# Patient Record
Sex: Female | Born: 1990 | Race: Black or African American | Hispanic: No | Marital: Single | Smoking: Current every day smoker
Health system: Southern US, Community
[De-identification: ages and names within clinical notes are randomized; demographics above are authoritative.]

## PROBLEM LIST (undated history)

## (undated) DIAGNOSIS — G43909 Migraine, unspecified, not intractable, without status migrainosus: Secondary | ICD-10-CM

## (undated) HISTORY — PX: HERNIA REPAIR: SHX51

---

## 2016-02-24 ENCOUNTER — Encounter (HOSPITAL_COMMUNITY): Payer: Self-pay | Admitting: *Deleted

## 2016-02-24 DIAGNOSIS — A084 Viral intestinal infection, unspecified: Secondary | ICD-10-CM | POA: Insufficient documentation

## 2016-02-24 DIAGNOSIS — F17210 Nicotine dependence, cigarettes, uncomplicated: Secondary | ICD-10-CM | POA: Insufficient documentation

## 2016-02-24 NOTE — ED Notes (Signed)
Pt c/o emesis since this morning. Also endorses diarrhea. Emesisx8, diarrheax3.

## 2016-02-25 ENCOUNTER — Emergency Department (HOSPITAL_COMMUNITY)
Admission: EM | Admit: 2016-02-25 | Discharge: 2016-02-25 | Disposition: A | Discharge status: Home / Self Care | Payer: Self-pay | Attending: Emergency Medicine | Admitting: Emergency Medicine

## 2016-02-25 DIAGNOSIS — A084 Viral intestinal infection, unspecified: Secondary | ICD-10-CM

## 2016-02-25 HISTORY — DX: Migraine, unspecified, not intractable, without status migrainosus: G43.909

## 2016-02-25 LAB — COMPREHENSIVE METABOLIC PANEL
ALT: 17 U/L (ref 14–54)
AST: 20 U/L (ref 15–41)
Albumin: 3.8 g/dL (ref 3.5–5.0)
Alkaline Phosphatase: 45 U/L (ref 38–126)
Anion gap: 3 — ABNORMAL LOW (ref 5–15)
BUN: 9 mg/dL (ref 6–20)
CHLORIDE: 109 mmol/L (ref 101–111)
CO2: 25 mmol/L (ref 22–32)
CREATININE: 0.83 mg/dL (ref 0.44–1.00)
Calcium: 8.9 mg/dL (ref 8.9–10.3)
GFR calc Af Amer: >60 mL/min (ref >60)
GFR calc non Af Amer: >60 mL/min (ref >60)
Glucose, Bld: 96 mg/dL (ref 65–99)
Potassium: 3.9 mmol/L (ref 3.5–5.1)
SODIUM: 137 mmol/L (ref 135–145)
Total Bilirubin: 0.6 mg/dL (ref 0.3–1.2)
Total Protein: 6.7 g/dL (ref 6.5–8.1)

## 2016-02-25 LAB — CBC
HCT: 37.4 % (ref 36.0–46.0)
Hemoglobin: 12.1 g/dL (ref 12.0–15.0)
MCH: 29.3 pg (ref 26.0–34.0)
MCHC: 32.4 g/dL (ref 30.0–36.0)
MCV: 90.6 fL (ref 78.0–100.0)
PLATELETS: 276 10*3/uL (ref 150–400)
RBC: 4.13 MIL/uL (ref 3.87–5.11)
RDW: 12.9 % (ref 11.5–15.5)
WBC: 4.8 10*3/uL (ref 4.0–10.5)

## 2016-02-25 LAB — URINALYSIS, ROUTINE W REFLEX MICROSCOPIC
BILIRUBIN URINE: NEGATIVE
GLUCOSE, UA: NEGATIVE mg/dL
KETONES UR: NEGATIVE mg/dL
Leukocytes, UA: NEGATIVE
Nitrite: NEGATIVE
PROTEIN: NEGATIVE mg/dL
Specific Gravity, Urine: 1.024 (ref 1.005–1.030)
pH: 7 (ref 5.0–8.0)

## 2016-02-25 LAB — I-STAT BETA HCG BLOOD, ED (MC, WL, AP ONLY): I-stat hCG, quantitative: <5 m[IU]/mL (ref <5)

## 2016-02-25 LAB — URINE MICROSCOPIC-ADD ON

## 2016-02-25 LAB — LIPASE, BLOOD: LIPASE: 41 U/L (ref 11–51)

## 2016-02-25 MED ORDER — SODIUM CHLORIDE 0.9 % IV BOLUS (SEPSIS)
1000.0000 mL | Freq: Once | INTRAVENOUS | Status: AC
  Administered 2016-02-25: 1000 mL via INTRAVENOUS

## 2016-02-25 MED ORDER — METOCLOPRAMIDE HCL 5 MG/ML IJ SOLN
10.0000 mg | INTRAMUSCULAR | Status: AC
  Administered 2016-02-25: 10 mg via INTRAVENOUS
  Filled 2016-02-25: qty 2

## 2016-02-25 MED ORDER — KETOROLAC TROMETHAMINE 30 MG/ML IJ SOLN
30.0000 mg | Freq: Once | INTRAMUSCULAR | Status: AC
  Administered 2016-02-25: 30 mg via INTRAVENOUS
  Filled 2016-02-25: qty 1

## 2016-02-25 MED ORDER — PROMETHAZINE HCL 25 MG PO TABS: 25.0000 mg | ORAL_TABLET | Freq: Four times a day (QID) | ORAL | Status: AC | PRN

## 2016-02-25 NOTE — Discharge Instructions (Signed)

## 2016-02-25 NOTE — ED Provider Notes (Signed)
CSN: [id number]     Arrival date & time 02/24/16  2330 History   First MD Initiated Contact with Patient 02/25/16 [phone]     Chief Complaint  Patient presents with  . Emesis     (Consider location/radiation/quality/duration/timing/severity/associated sxs/prior Treatment) HPI Comments: 25 year old female with a history of migraine headaches presents to the emergency department for evaluation of nausea, vomiting, and diarrhea. She states the diarrhea began a few days ago. She has had 3 watery, nonbloody bowel movements today. She developed nausea and vomiting yesterday morning shortly after waking. She has had 8 episodes of nonbloody, nonbilious emesis and onset. She denies taking any medications prior to arrival for symptoms. Her last episode of emesis was at approximately 2100. She declined Zofran in the emergency department because it has not helped her with nausea in the past. She states that she had some generalized abdominal cramping which has improved. She has been able to tolerate a small amount of fluids while in the emergency department. Patient with a history of hernia repair; no other abdominal surgeries. She has had no fever, chest pain, shortness of breath, dysuria or hematuria, or vaginal complaints. Patient is currently on her menstrual cycle. She denies known sick contacts.  Patient is a 25 y.o. female presenting with vomiting. The history is provided by the patient. No language interpreter was used.  Emesis Associated symptoms: abdominal pain (mild, diffuse, cramping) and diarrhea     Past Medical History  Diagnosis Date  . Migraines    Past Surgical History  Procedure Laterality Date  . Hernia repair     No family history on file. Social History  Substance Use Topics  . Smoking status: Current Every Day Smoker -- 0.50 packs/day    Types: Cigarettes  . Smokeless tobacco: None  . Alcohol Use: Yes   OB History    No data available      Review of Systems   Constitutional: Negative for fever.  Gastrointestinal: Positive for nausea, vomiting, abdominal pain (mild, diffuse, cramping) and diarrhea.  Genitourinary: Positive for vaginal bleeding (on menses). Negative for dysuria and hematuria.  All other systems reviewed and are negative.   Allergies  Review of patient's allergies indicates no known allergies.  Home Medications   Prior to Admission medications   Medication Sig Start Date End Date Taking? Authorizing Provider  medroxyPROGESTERone (DEPO-PROVERA) 150 MG/ML injection Inject 150 mg into the muscle every 3 (three) months.   Yes Historical Provider, MD  promethazine (PHENERGAN) 25 MG tablet Take 1 tablet (25 mg total) by mouth every 6 (six) hours as needed for nausea or vomiting. 02/25/16   Antony MaduraKelly Khristian Phillippi, PA-C   BP 115/76 mmHg  Pulse 55  Temp(Src) 98.3 F (36.8 C) (Oral)  Resp 16  SpO2 100%  LMP 02/23/2016   Physical Exam  Constitutional: She is oriented to person, place, and time. She appears well-developed and well-nourished. No distress.  Nontoxic appearing, in no distress.  HENT:  Head: Normocephalic and atraumatic.  Eyes: Conjunctivae and EOM are normal. No scleral icterus.  Neck: Normal range of motion.  Cardiovascular: Normal rate, regular rhythm and intact distal pulses.   Pulmonary/Chest: Effort normal. No respiratory distress. She has no wheezes.  Respirations even and unlabored  Abdominal: Soft. She exhibits no distension. There is no tenderness. There is no rebound and no guarding.  Soft, nontender, nondistended abodmen  Musculoskeletal: Normal range of motion.  Neurological: She is alert and oriented to person, place, and time. She exhibits normal muscle tone.  Coordination normal.  GCS 15. Patient moving all extremities.  Skin: Skin is warm and dry. No rash noted. She is not diaphoretic. No erythema. No pallor.  Psychiatric: She has a normal mood and affect. Her behavior is normal.  Nursing note and vitals  reviewed.   ED Course  Procedures (including critical care time) Labs Review Labs Reviewed  COMPREHENSIVE METABOLIC PANEL - Abnormal; Notable for the following:    Anion gap 3 (*)    All other components within normal limits  URINALYSIS, ROUTINE W REFLEX MICROSCOPIC (NOT AT [facility]) - Abnormal; Notable for the following:    Hgb urine dipstick MODERATE (*)    All other components within normal limits  URINE MICROSCOPIC-ADD ON - Abnormal; Notable for the following:    Squamous Epithelial / LPF 0-5 (*)    Bacteria, UA RARE (*)    All other components within normal limits  LIPASE, BLOOD  CBC  I-STAT BETA HCG BLOOD, ED ([facility], [facility], [facility] ONLY)    Imaging Review No results found.   I have personally reviewed and evaluated these images and lab results as part of my medical decision-making.   EKG Interpretation None       6:06 AM Patient reassessed. Abdominal exam stable. Patient with no additional complaints. MDM   Final diagnoses:  Viral gastroenteritis    Patient with symptoms consistent with viral gastroenteritis. Vitals are stable, no fever. No signs of dehydration, tolerating PO fluids > 6 oz. Lungs are clear. No focal abdominal pain and reexamination stable. Doubt appendicitis, cholecystitis, pancreatitis, ruptured viscus, UTI, kidney stone, or other emergent abdominal etiology. Supportive therapy indicated with return if symptoms worsen. Patient discharged in satisfactory condition with no unaddressed concerns.   Filed Vitals:   02/25/16 0341 02/25/16 0415 02/25/16 0430 02/25/16 0515  BP: 113/59 104/67 110/86 115/76  Pulse: 60 65 59 55  Temp:      TempSrc:      Resp: 16     SpO2: 100% 100% 100% 100%     Antony MaduraKelly Draya Felker, PA-C 02/25/16 [phone]  Tomasita CrumbleAdeleke Oni, MD 02/25/16 [phone]

## 2017-12-11 ENCOUNTER — Encounter (HOSPITAL_COMMUNITY): Payer: Self-pay | Admitting: Emergency Medicine

## 2017-12-11 ENCOUNTER — Emergency Department (HOSPITAL_COMMUNITY): Payer: Medicaid Other

## 2017-12-11 ENCOUNTER — Emergency Department (HOSPITAL_COMMUNITY)
Admission: EM | Admit: 2017-12-11 | Discharge: 2017-12-11 | Disposition: A | Discharge status: Home / Self Care | Payer: Medicaid Other | Attending: Emergency Medicine | Admitting: Emergency Medicine

## 2017-12-11 DIAGNOSIS — S93401A Sprain of unspecified ligament of right ankle, initial encounter: Secondary | ICD-10-CM | POA: Insufficient documentation

## 2017-12-11 DIAGNOSIS — Y999 Unspecified external cause status: Secondary | ICD-10-CM | POA: Insufficient documentation

## 2017-12-11 DIAGNOSIS — Y929 Unspecified place or not applicable: Secondary | ICD-10-CM | POA: Insufficient documentation

## 2017-12-11 DIAGNOSIS — X58XXXA Exposure to other specified factors, initial encounter: Secondary | ICD-10-CM | POA: Insufficient documentation

## 2017-12-11 DIAGNOSIS — Y939 Activity, unspecified: Secondary | ICD-10-CM | POA: Insufficient documentation

## 2017-12-11 NOTE — ED Triage Notes (Signed)
Pt from home with c/o right ankle pain x 2 weeks ago. Pt states she was seen for this and was told by [facility] that it is a sprained ankle. Pt is ambulatory without difficulty. Pt rates pain 6/10

## 2017-12-11 NOTE — ED Provider Notes (Signed)
[facility]-EMERGENCY DEPT Provider Note   CSN: [id number] Arrival date & time: 12/11/17  4098     History   Chief Complaint Chief Complaint  Patient presents with  . Ankle Pain    right    HPI Karen Gray is a 27 y.o. female.  HPI Patient reports injury to her right ankle several weeks ago.  She states it is feeling much better.  She returns to the ER today because her work needs a work release form stating that she can safely return to work.  She states she has been able to walk on it over the past several days and is feeling much better.  She was initially referred to an orthopedic surgeon but does not have insurance and was unable to follow-up.  She has no significant pain at this time.  She denies numbness or paresthesias.  She reports no significant swelling of her right foot or ankle.   Past Medical History:  Diagnosis Date  . Migraines     There are no active problems to display for this patient.   Past Surgical History:  Procedure Laterality Date  . HERNIA REPAIR       OB History   None      Home Medications    Prior to Admission medications   Medication Sig Start Date End Date Taking? Authorizing Provider  medroxyPROGESTERone (DEPO-PROVERA) 150 MG/ML injection Inject 150 mg into the muscle every 3 (three) months.   Yes [provider]  promethazine (PHENERGAN) 25 MG tablet Take 1 tablet (25 mg total) by mouth every 6 (six) hours as needed for nausea or vomiting. 02/25/16  Yes Antony Madura, PA-C    Family History No family history on file.  Social History Social History   Tobacco Use  . Smoking status: Current Every Day Smoker    Packs/day: 0.50    Types: Cigarettes  Substance Use Topics  . Alcohol use: Yes  . Drug use: No     Allergies   Patient has no known allergies.   Review of Systems Review of Systems  All other systems reviewed and are negative.    Physical Exam Updated Vital  Signs BP 117/74 (BP Location: Right Arm)   Pulse 85   Temp 98.9 F (37.2 C) (Oral)   Resp 16   LMP 12/11/2017 (Exact Date)   SpO2 100%   Physical Exam  Constitutional: She is oriented to person, place, and time. She appears well-developed and well-nourished.  HENT:  Head: Normocephalic.  Eyes: EOM are normal.  Neck: Normal range of motion.  Pulmonary/Chest: Effort normal.  Abdominal: She exhibits no distension.  Musculoskeletal: Normal range of motion.  Normal PT and DP pulse in the right foot.  Compartments of the right lower extremity and right foot are soft.  No tenderness of the medial or lateral malleolus of the right ankle.  No tenderness at the base of the right fifth metacarpal.  Neurological: She is alert and oriented to person, place, and time.  Psychiatric: She has a normal mood and affect.  Nursing note and vitals reviewed.    ED Treatments / Results  Labs (all labs ordered are listed, but only abnormal results are displayed) Labs Reviewed - No data to display  EKG None  Radiology Dg Ankle Complete Right  Result Date: 12/11/2017 CLINICAL DATA:  Diffuse right ankle pain EXAM: RIGHT ANKLE - COMPLETE 3+ VIEW COMPARISON:  None. FINDINGS: There is no evidence of fracture, dislocation, or  joint effusion. There is no evidence of arthropathy or other focal bone abnormality. Soft tissues are unremarkable. IMPRESSION: Negative. Electronically Signed   By: Charlett NoseKevin  Dover M.D.   On: 12/11/2017 07:48    Procedures Procedures (including critical care time)  Medications Ordered in ED Medications - No data to display   Initial Impression / Assessment and Plan / ED Course  I have reviewed the triage vital signs and the nursing notes.  Pertinent labs & imaging results that were available during my care of the patient were reviewed by me and considered in my medical decision making (see chart for details).     X-ray personally reviewed.  X-ray demonstrates no acute osseous  injury or obvious soft tissue swelling  Patient can safely return to work with weightbearing as tolerated.  X-ray without osseous injury.  Final Clinical Impressions(s) / ED Diagnoses   Final diagnoses:  None    ED Discharge Orders    None       Azalia Bilisampos, Farzad Tibbetts, MD 12/11/17 [phone]

## 2018-02-11 ENCOUNTER — Other Ambulatory Visit: Payer: Self-pay

## 2018-02-11 ENCOUNTER — Emergency Department (HOSPITAL_BASED_OUTPATIENT_CLINIC_OR_DEPARTMENT_OTHER)
Admission: EM | Admit: 2018-02-11 | Discharge: 2018-02-11 | Disposition: A | Discharge status: Home / Self Care | Payer: Self-pay | Attending: Physician Assistant | Admitting: Physician Assistant

## 2018-02-11 ENCOUNTER — Encounter (HOSPITAL_BASED_OUTPATIENT_CLINIC_OR_DEPARTMENT_OTHER): Payer: Self-pay | Admitting: *Deleted

## 2018-02-11 ENCOUNTER — Emergency Department (HOSPITAL_BASED_OUTPATIENT_CLINIC_OR_DEPARTMENT_OTHER): Payer: Self-pay

## 2018-02-11 DIAGNOSIS — R059 Cough, unspecified: Secondary | ICD-10-CM

## 2018-02-11 DIAGNOSIS — F17210 Nicotine dependence, cigarettes, uncomplicated: Secondary | ICD-10-CM | POA: Insufficient documentation

## 2018-02-11 DIAGNOSIS — Z3202 Encounter for pregnancy test, result negative: Secondary | ICD-10-CM | POA: Insufficient documentation

## 2018-02-11 DIAGNOSIS — R112 Nausea with vomiting, unspecified: Secondary | ICD-10-CM | POA: Insufficient documentation

## 2018-02-11 DIAGNOSIS — R05 Cough: Secondary | ICD-10-CM | POA: Insufficient documentation

## 2018-02-11 LAB — URINALYSIS, ROUTINE W REFLEX MICROSCOPIC
Bilirubin Urine: NEGATIVE
Glucose, UA: NEGATIVE mg/dL
Ketones, ur: NEGATIVE mg/dL
Leukocytes, UA: NEGATIVE
Nitrite: NEGATIVE
Protein, ur: NEGATIVE mg/dL
Specific Gravity, Urine: 1.01 (ref 1.005–1.030)
pH: 6.5 (ref 5.0–8.0)

## 2018-02-11 LAB — COMPREHENSIVE METABOLIC PANEL
ALBUMIN: 4.2 g/dL (ref 3.5–5.0)
ALT: 10 U/L — AB (ref 14–54)
AST: 19 U/L (ref 15–41)
Alkaline Phosphatase: 71 U/L (ref 38–126)
Anion gap: 6 (ref 5–15)
BUN: 8 mg/dL (ref 6–20)
CO2: 28 mmol/L (ref 22–32)
CREATININE: 0.83 mg/dL (ref 0.44–1.00)
Calcium: 8.8 mg/dL — ABNORMAL LOW (ref 8.9–10.3)
Chloride: 102 mmol/L (ref 101–111)
GFR calc Af Amer: >60 mL/min (ref >60)
GLUCOSE: 118 mg/dL — AB (ref 65–99)
POTASSIUM: 3.4 mmol/L — AB (ref 3.5–5.1)
Sodium: 136 mmol/L (ref 135–145)
Total Bilirubin: 0.9 mg/dL (ref 0.3–1.2)
Total Protein: 7.6 g/dL (ref 6.5–8.1)

## 2018-02-11 LAB — CBC
HEMATOCRIT: 38 % (ref 36.0–46.0)
Hemoglobin: 12.6 g/dL (ref 12.0–15.0)
MCH: 29.5 pg (ref 26.0–34.0)
MCHC: 33.2 g/dL (ref 30.0–36.0)
MCV: 89 fL (ref 78.0–100.0)
PLATELETS: 293 10*3/uL (ref 150–400)
RBC: 4.27 MIL/uL (ref 3.87–5.11)
RDW: 12.8 % (ref 11.5–15.5)
WBC: 7 10*3/uL (ref 4.0–10.5)

## 2018-02-11 LAB — URINALYSIS, MICROSCOPIC (REFLEX)

## 2018-02-11 LAB — LIPASE, BLOOD: Lipase: 32 U/L (ref 11–51)

## 2018-02-11 LAB — PREGNANCY, URINE: Preg Test, Ur: NEGATIVE

## 2018-02-11 MED ORDER — SODIUM CHLORIDE 0.9 % IV BOLUS
1000.0000 mL | Freq: Once | INTRAVENOUS | Status: AC
  Administered 2018-02-11: 1000 mL via INTRAVENOUS

## 2018-02-11 MED ORDER — KETOROLAC TROMETHAMINE 30 MG/ML IJ SOLN
30.0000 mg | Freq: Once | INTRAMUSCULAR | Status: AC
  Administered 2018-02-11: 30 mg via INTRAVENOUS
  Filled 2018-02-11: qty 1

## 2018-02-11 MED ORDER — METOCLOPRAMIDE HCL 5 MG/ML IJ SOLN
10.0000 mg | Freq: Once | INTRAMUSCULAR | Status: AC
  Administered 2018-02-11: 10 mg via INTRAVENOUS
  Filled 2018-02-11: qty 2

## 2018-02-11 NOTE — ED Notes (Signed)
Patient starts to get anxious and kept saying, "I feel claustrophobic here. It's hot here.  I need to go to the bathroom."  I opened the door, adjusted the thermostat and guided her to the bathroom.  She kept saying, "take this out, referring to the IV."  Informed her to use the bathroom first and she can come back to the room.  Informed EDP, Ms. Asher MuirJamie Ward about the patient's behavior.  EDP stated that she can go home.  All her labs are normal.  EDP also said that she don't have to sign the discharge form if she don't want to because she will be discharging her.

## 2018-02-11 NOTE — ED Provider Notes (Signed)
[facility] EMERGENCY DEPARTMENT Provider Note   CSN: [id number] Arrival date & time: 02/11/18  1449     History   Chief Complaint Chief Complaint  Patient presents with  . Emesis    HPI Karen Gray is a 27 y.o. female.  The history is provided by the patient and medical records. No language interpreter was used.  Emesis   Associated symptoms include cough. Pertinent negatives include no abdominal pain and no diarrhea.   Karen Gray is a 27 y.o. female  with a PMH of migraines who presents to the Emergency Department complaining of nausea and vomiting that began 2 days ago.  She also endorses cough which began today.  Denies any fever or chills.  Denies abdominal pain.  She does report drinking alcohol heavily during the weekend.  When asked if she had anything to eat or drink today, she reports trying to drink a beer, but then she threw up again.  She has not taken any medications prior to arrival for her symptoms.  Denies any sick contacts.  No recent travel.  No foods out of her ordinary.  No diarrhea or blood in her stool. No vaginal discharge.  Past Medical History:  Diagnosis Date  . Migraines     There are no active problems to display for this patient.   Past Surgical History:  Procedure Laterality Date  . HERNIA REPAIR       OB History   None      Home Medications    Prior to Admission medications   Medication Sig Start Date End Date Taking? Authorizing Provider  medroxyPROGESTERone (DEPO-PROVERA) 150 MG/ML injection Inject 150 mg into the muscle every 3 (three) months.    [provider]  promethazine (PHENERGAN) 25 MG tablet Take 1 tablet (25 mg total) by mouth every 6 (six) hours as needed for nausea or vomiting. 02/25/16   Antony Madura, PA-C    Family History No family history on file.  Social History Social History   Tobacco Use  . Smoking status: Current Every Day Smoker    Packs/day:  0.50    Types: Cigarettes  . Smokeless tobacco: Never Used  Substance Use Topics  . Alcohol use: Yes  . Drug use: No     Allergies   Patient has no known allergies.   Review of Systems Review of Systems  HENT: Positive for congestion.   Respiratory: Positive for cough.   Gastrointestinal: Positive for nausea and vomiting. Negative for abdominal pain, constipation and diarrhea.  All other systems reviewed and are negative.    Physical Exam Updated Vital Signs BP 120/79 (BP Location: Right Arm)   Pulse 81   Temp 98.4 F (36.9 C) (Oral)   Resp 18   Ht 5\' 5"  (1.651 m)   Wt 54.4 kg (120 lb)   LMP 01/04/2018   SpO2 100%   BMI 19.97 kg/m   Physical Exam  Constitutional: She is oriented to person, place, and time. She appears well-developed and well-nourished. No distress.  Nontoxic-appearing.  HENT:  Head: Normocephalic and atraumatic.  Neck: Neck supple.  Cardiovascular: Normal rate, regular rhythm and normal heart sounds.  No murmur heard. Pulmonary/Chest: Effort normal and breath sounds normal. No respiratory distress.  Lungs clear to ausculation.  Abdominal: Soft. She exhibits no distension.  No abdominal tenderness.  Neurological: She is alert and oriented to person, place, and time.  Skin: Skin is warm and dry.  Nursing note and vitals  reviewed.    ED Treatments / Results  Labs (all labs ordered are listed, but only abnormal results are displayed) Labs Reviewed  URINALYSIS, ROUTINE W REFLEX MICROSCOPIC - Abnormal; Notable for the following components:      Result Value   Hgb urine dipstick TRACE (*)    All other components within normal limits  COMPREHENSIVE METABOLIC PANEL - Abnormal; Notable for the following components:   Potassium 3.4 (*)    Glucose, Bld 118 (*)    Calcium 8.8 (*)    ALT 10 (*)    All other components within normal limits  URINALYSIS, MICROSCOPIC (REFLEX) - Abnormal; Notable for the following components:   Bacteria, UA RARE (*)     All other components within normal limits  PREGNANCY, URINE  LIPASE, BLOOD  CBC    EKG None  Radiology Dg Chest 2 View  Result Date: 02/11/2018 CLINICAL DATA:  Cough and shortness of breath for 2 days, initial encounter EXAM: CHEST - 2 VIEW COMPARISON:  None. FINDINGS: The heart size and mediastinal contours are within normal limits. Both lungs are clear. The visualized skeletal structures are unremarkable. IMPRESSION: No active cardiopulmonary disease. Electronically Signed   By: Alcide CleverMark  Lukens M.D.   On: 02/11/2018 17:59    Procedures Procedures (including critical care time)  Medications Ordered in ED Medications  sodium chloride 0.9 % bolus 1,000 mL (0 mLs Intravenous Stopped 02/11/18 1825)  metoCLOPramide (REGLAN) injection 10 mg (10 mg Intravenous Given 02/11/18 1804)  ketorolac (TORADOL) 30 MG/ML injection 30 mg (30 mg Intravenous Given 02/11/18 1804)     Initial Impression / Assessment and Plan / ED Course  I have reviewed the triage vital signs and the nursing notes.  Pertinent labs & imaging results that were available during my care of the patient were reviewed by me and considered in my medical decision making (see chart for details).    Karen Gray is a 27 y.o. female who presents to ED for nausea, vomiting for 2 days and cough which began today. On exam, patient is afebrile, non-toxic appearing with a non-surgical abdominal exam. Labs reviewed and reassuring. UA without infection. IV fluids and nausea medications given. CXR negative without PNA. Patient given fluids, nausea medication and oral.  She reported feeling very claustrophobic and wanted to go home. Sxs c/w viral etiology. Encouraged to stay to PO challenge, but she would like to go home. Evaluation does not show pathology that would require ongoing emergent intervention or inpatient treatment at this time.   Final Clinical Impressions(s) / ED Diagnoses   Final diagnoses:  Cough    ED  Discharge Orders    None       Posey Petrik, Chase PicketJaime Pilcher, PA-C 02/11/18 1843    Abelino DerrickMackuen, Courteney Lyn, MD 02/11/18 [phone]

## 2018-02-11 NOTE — ED Triage Notes (Signed)
Vomiting since yesterday   

## 2018-02-11 NOTE — ED Notes (Signed)
ED Provider at bedside. 

## 2018-11-11 IMAGING — CR DG ANKLE COMPLETE 3+V*R*
3 series · 3 of 3 positions shown · non-contrast
Comparison: None.

CLINICAL DATA: Diffuse right ankle pain

EXAM:
RIGHT ANKLE - COMPLETE 3+ VIEW

[x ankle ap right]
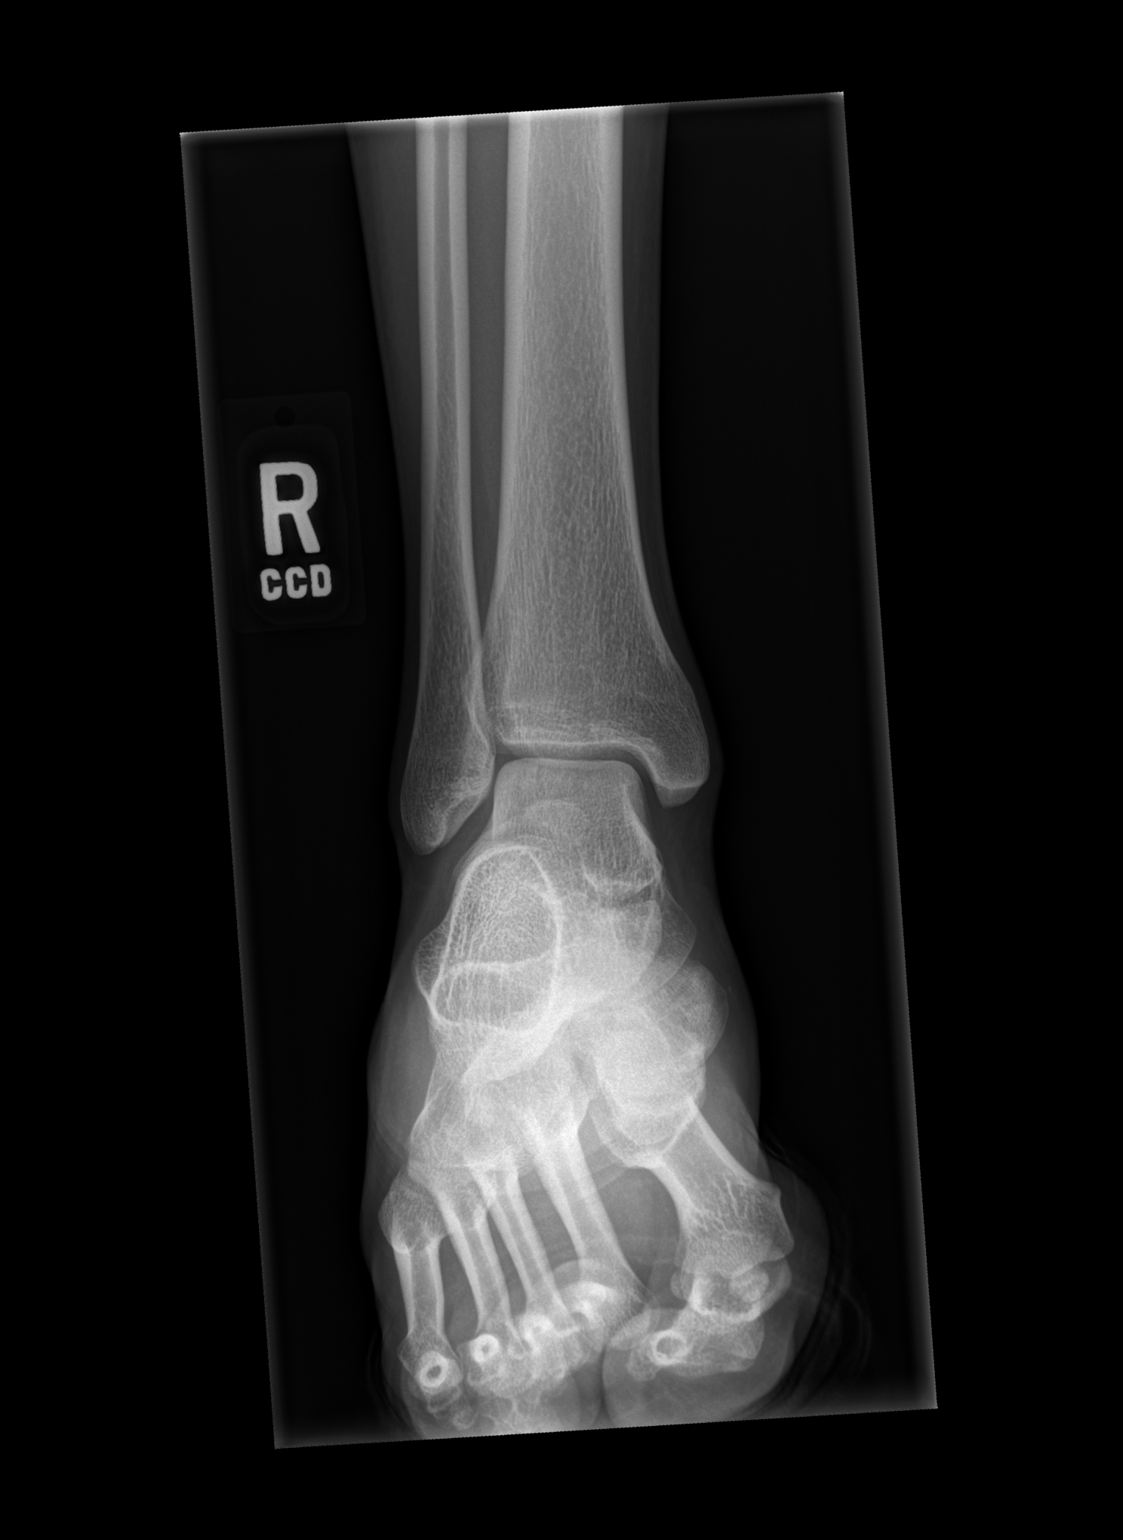

[x ankle obl right]
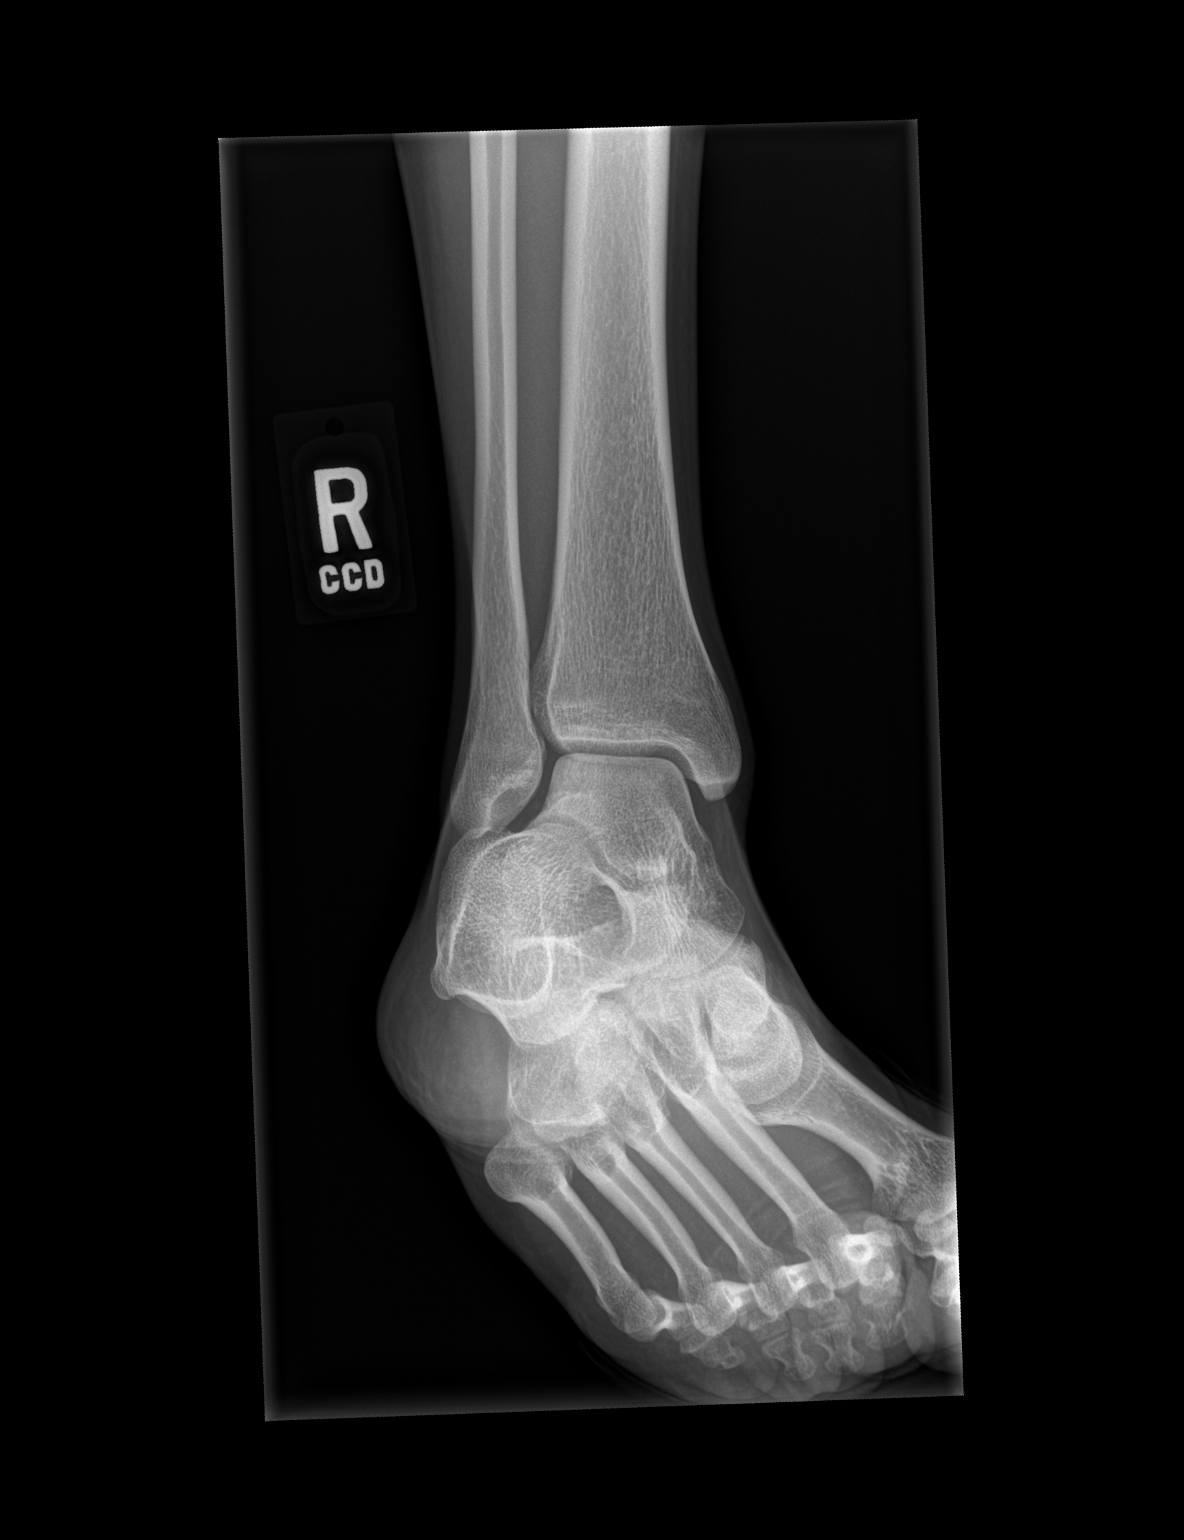

[x ankle lat right]
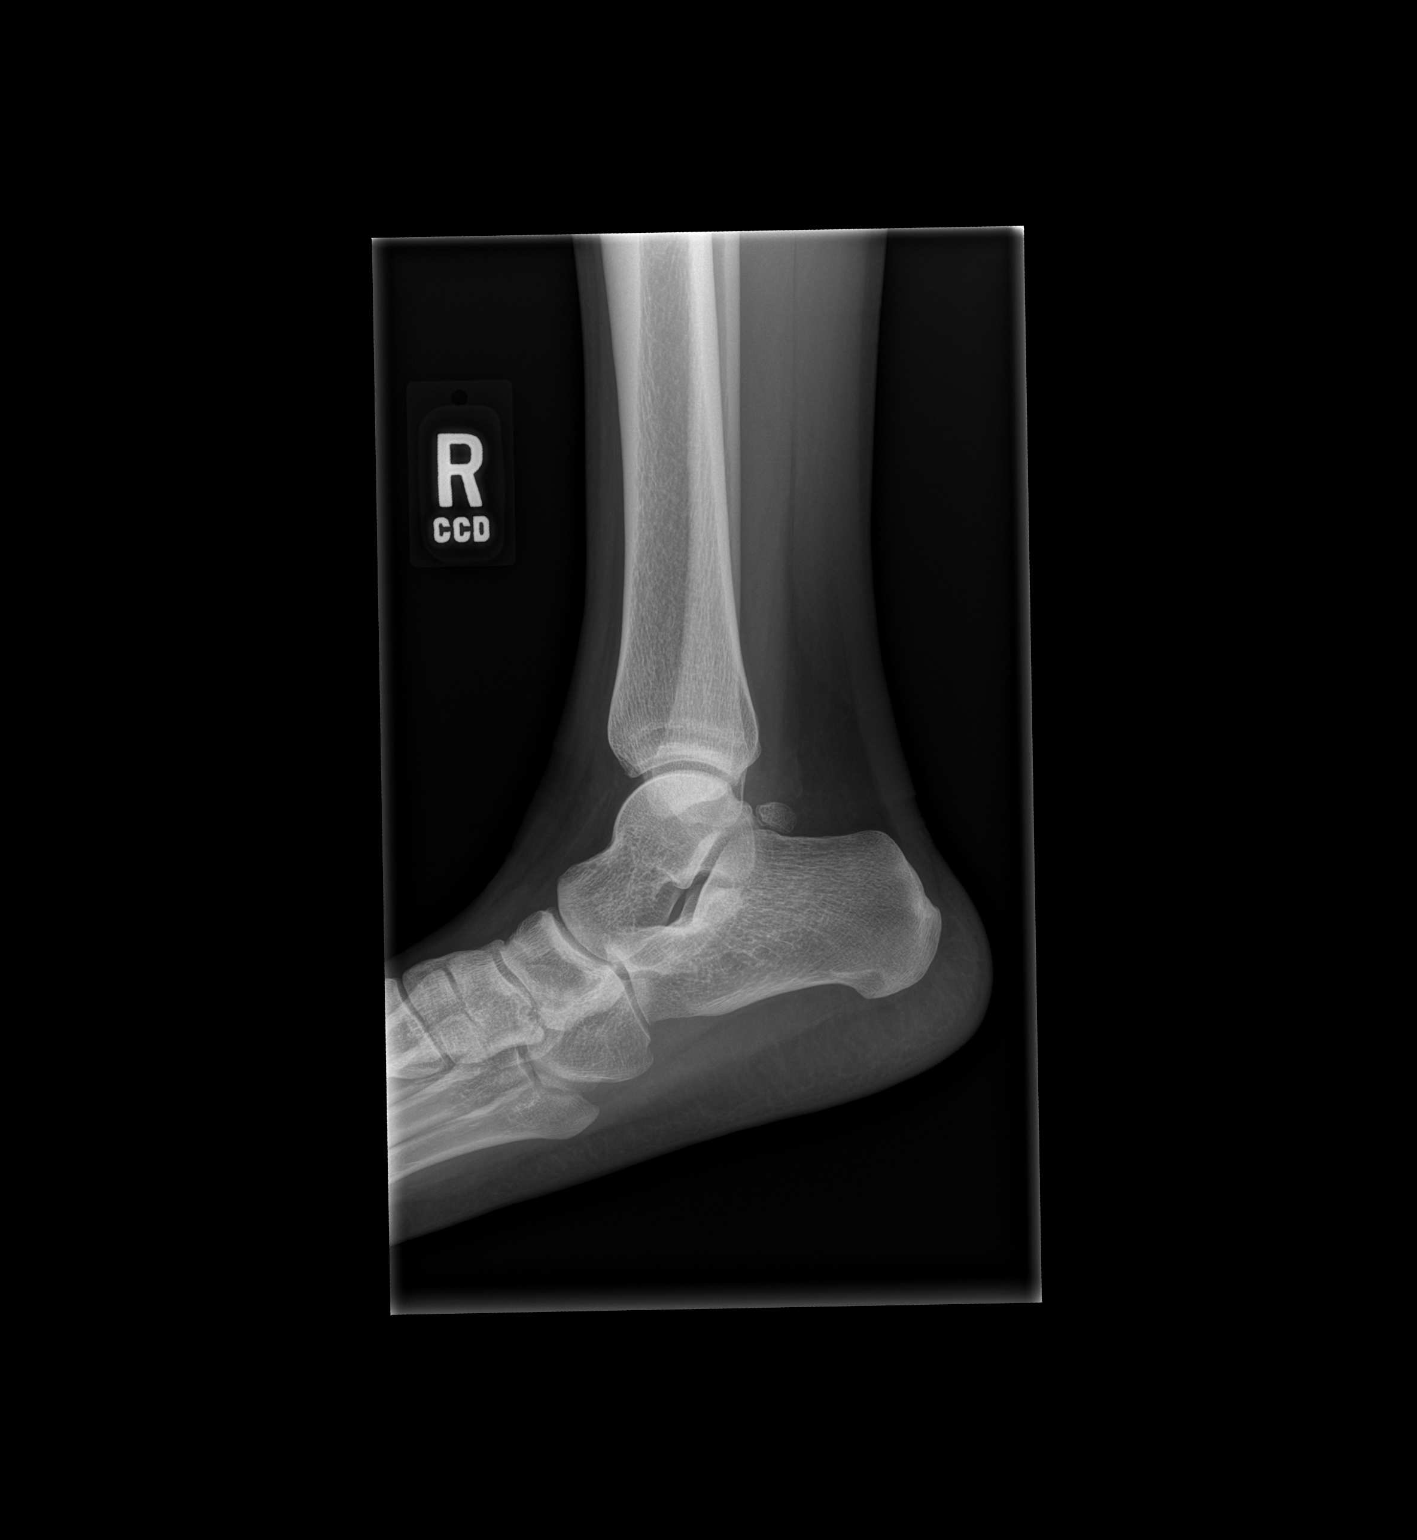

[3 of 3 positions shown; findings below may reference images not displayed]

FINDINGS: There is no evidence of fracture, dislocation, or joint effusion.
There is no evidence of arthropathy or other focal bone abnormality.
Soft tissues are unremarkable.
IMPRESSION: Negative.
